# Patient Record
Sex: Male | Born: 2000 | Race: Black or African American | Hispanic: No | Marital: Single | Smoking: Current every day smoker
Health system: Southern US, Community
[De-identification: ages and names within clinical notes are randomized; demographics above are authoritative.]

## PROBLEM LIST (undated history)

## (undated) DIAGNOSIS — J45909 Unspecified asthma, uncomplicated: Secondary | ICD-10-CM

---
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
## 2018-05-25 ENCOUNTER — Encounter: Payer: Self-pay | Admitting: Emergency Medicine

## 2018-05-25 ENCOUNTER — Emergency Department
Admission: EM | Admit: 2018-05-25 | Discharge: 2018-05-25 | Disposition: A | Discharge status: Home / Self Care | Payer: Commercial Managed Care - PPO | Attending: Emergency Medicine | Admitting: Emergency Medicine

## 2018-05-25 DIAGNOSIS — J45909 Unspecified asthma, uncomplicated: Secondary | ICD-10-CM | POA: Diagnosis not present

## 2018-05-25 DIAGNOSIS — R062 Wheezing: Secondary | ICD-10-CM

## 2018-05-25 DIAGNOSIS — R0602 Shortness of breath: Secondary | ICD-10-CM

## 2018-05-25 DIAGNOSIS — J4521 Mild intermittent asthma with (acute) exacerbation: Secondary | ICD-10-CM | POA: Insufficient documentation

## 2018-05-25 DIAGNOSIS — R05 Cough: Secondary | ICD-10-CM

## 2018-05-25 DIAGNOSIS — R059 Cough, unspecified: Secondary | ICD-10-CM

## 2018-05-25 MED ORDER — IPRATROPIUM-ALBUTEROL 0.5-2.5 (3) MG/3ML IN SOLN
3.0000 mL | Freq: Once | RESPIRATORY_TRACT | Status: AC
  Administered 2018-05-25: 3 mL via RESPIRATORY_TRACT
  Filled 2018-05-25: qty 3

## 2018-05-25 MED ORDER — PREDNISONE 10 MG (21) PO TBPK: ORAL_TABLET | ORAL | 0 refills | Status: DC

## 2018-05-25 MED ORDER — ALBUTEROL SULFATE HFA 108 (90 BASE) MCG/ACT IN AERS: 2.0000 | INHALATION_SPRAY | RESPIRATORY_TRACT | 0 refills | Status: DC | PRN

## 2018-05-25 NOTE — Discharge Instructions (Addendum)
You have been diagnosed with an asthma exacerbation.  We gave you nebulizer treatment in the ER.  I have provided you with prescriptions for prednisone for 6 days and an albuterol inhaler.  If symptoms persist or worsen, please return to the ER.

## 2018-05-25 NOTE — ED Notes (Signed)
No peripheral IV placed this visit.    Discharge instructions reviewed with patient. Questions fielded by this RN. Patient verbalizes understanding of instructions. Patient discharged home in stable condition per provider. No acute distress noted at time of discharge.    

## 2018-05-25 NOTE — ED Triage Notes (Addendum)
Pt to ED with c/o of Asthma attack. Pt currently out of Asthma Rx. Pt NAD at this time and Sat 95% in triage.

## 2018-05-25 NOTE — ED Notes (Signed)
Here for asthma exacerbation. Inhaler at home ran out.

## 2018-05-25 NOTE — ED Provider Notes (Signed)
[facility] Emergency Department Provider Note ____________________________________________  Time seen: 82  I have reviewed the triage vital signs and the nursing notes.  HISTORY  Chief Complaint  Asthma   HPI Timothy Schaefer is a 18 y.o. male presents to the ER today with complaint of cough, wheezing and shortness of breath.  He reports this started yesterday.  He denies runny nose, nasal congestion, ear pain, sore throat.  He denies fever, chills or body aches.  He has not taken anything over-the-counter for his symptoms.  He has a history of asthma but has been out of his Pro-air.  He does vape at times and does smoke marijuana.  He has not had sick contacts that he is aware of.  He is up-to-date on his flu vaccine.  Past Medical History:  Diagnosis Date  . Asthma     There are no active problems to display for this patient.   History reviewed. No pertinent surgical history.  Prior to Admission medications   Medication Sig Start Date End Date Taking? Authorizing Provider  albuterol (PROVENTIL HFA;VENTOLIN HFA) 108 (90 Base) MCG/ACT inhaler Inhale 2 puffs into the lungs every 4 (four) hours as needed for wheezing or shortness of breath. 05/25/18   Lorre Munroe, NP  predniSONE (STERAPRED UNI-PAK 21 TAB) 10 MG (21) TBPK tablet As directed 05/25/18   Lorre Munroe, NP    Allergies Dairy aid [lactase]  History reviewed. No pertinent family history.  Social History Social History   Tobacco Use  . Smoking status: Never Smoker  . Smokeless tobacco: Never Used  Substance Use Topics  . Alcohol use: No  . Drug use: No    Review of Systems  Constitutional: Negative for fever, chills or body aches. ENT: Negative for runny nose, nasal congestion, ear pain or sore throat. Cardiovascular: Negative for chest pain. Respiratory: Positive for cough, wheezing and shortness of breath.   Gastrointestinal: Negative for abdominal pain, vomiting and  diarrhea. ____________________________________________  PHYSICAL EXAM:  VITAL SIGNS: ED Triage Vitals [05/25/18 1838]  Enc Vitals Group     BP 132/84     Pulse Rate (!) 114     Resp (!) 22     Temp 99.9 F (37.7 C)     Temp src      SpO2 96 %     Weight 140 lb (63.5 kg)     Height 5\' 6"  (1.676 m)     Head Circumference      Peak Flow      Pain Score 0     Pain Loc      Pain Edu?      Excl. in GC?     Constitutional: Alert and oriented. Well appearing and in no distress. Head: Normocephalic. Eyes: Conjunctivae are normal. PERRL. Normal extraocular movements Ears: Canals clear. TMs intact bilaterally. Nose: No congestion/rhinorrhea/epistaxis. Mouth/Throat: Mucous membranes are moist.  No posterior pharynx exudate or erythema noted Hematological/Lymphatic/Immunological: No cervical lymphadenopathy. Cardiovascular: Normal rate, regular rhythm.  Respiratory: Normal respiratory effort with bilateral expiratory wheezing noted. Neurologic:  Normal speech and language. No gross focal neurologic deficits are appreciated. ____________________________________________  INITIAL IMPRESSION / ASSESSMENT AND PLAN / ED COURSE  Cough, Wheezing, SOB, Asthma Exacerbation:  Duoneb given in ER Lungs much clearer, still with some mild intermittent expiratory wheezing RX for Pred Taper x 6 days RX for Proair inhaler ____________________________________________  FINAL CLINICAL IMPRESSION(S) / ED DIAGNOSES  Final diagnoses:  Cough  Wheezing  Shortness of breath  Mild intermittent asthma with exacerbation   [name], NP    Lorre MunroeBaity, Anastasya Jewell W, NP 05/25/18 Denyse [id number]    [place], [place], MD 05/26/18 [phone]

## 2018-07-30 IMAGING — CT CT CERVICAL SPINE W/O CM
3 of 7 series · 11 of 33 positions shown, 13 images · non-contrast
Comparison: None.

CLINICAL DATA: Fall, head and neck injury, pain

EXAM:
CT HEAD WITHOUT CONTRAST
CT CERVICAL SPINE WITHOUT CONTRAST
TECHNIQUE: Multidetector CT imaging of the head and cervical spine was
performed following the standard protocol without intravenous
contrast. Multiplanar CT image reconstructions of the cervical spine
were also generated.

[Series 10: sagittal bone · sagittal · 0.21mm/px · 5 of 61 slices shown]
[im 11/61  bone]
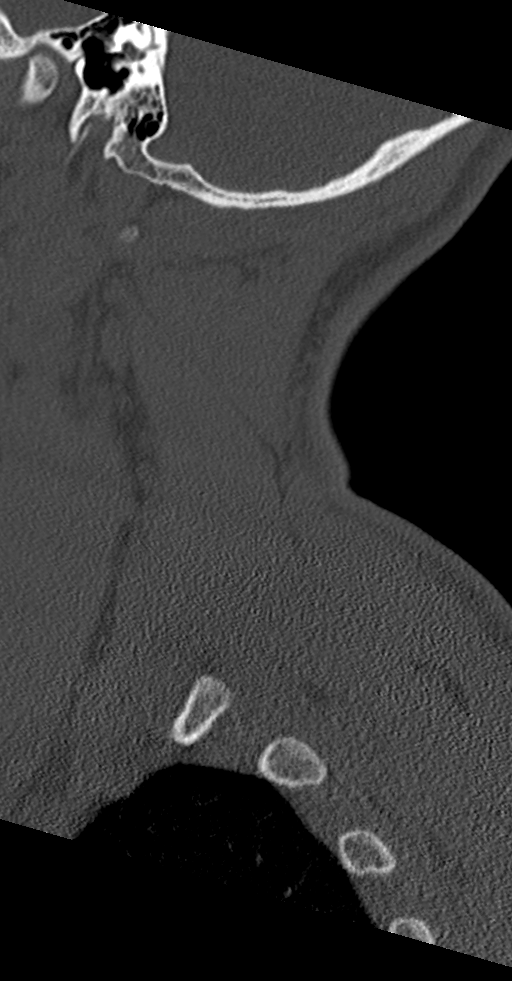
[im 21/61  bone]
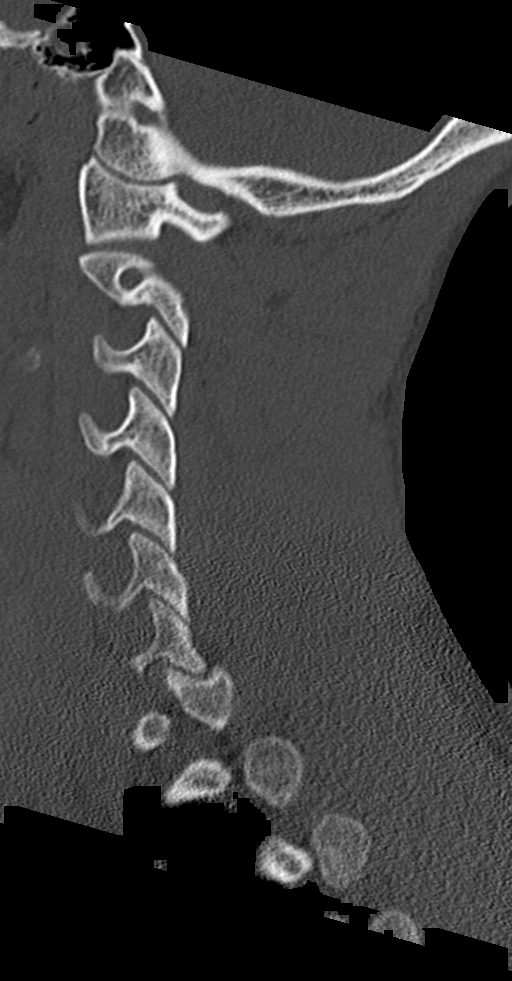
[im 31/61  bone]
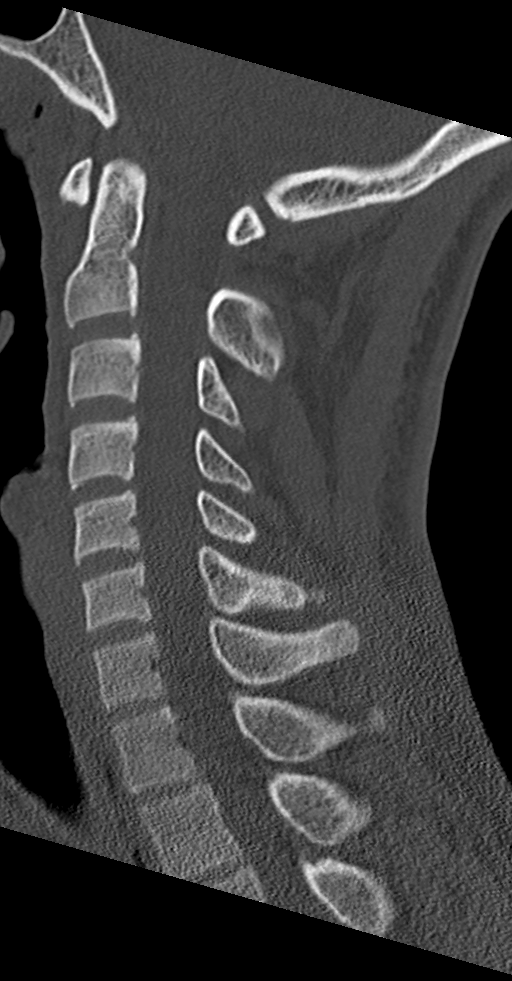
[im 41/61  bone]
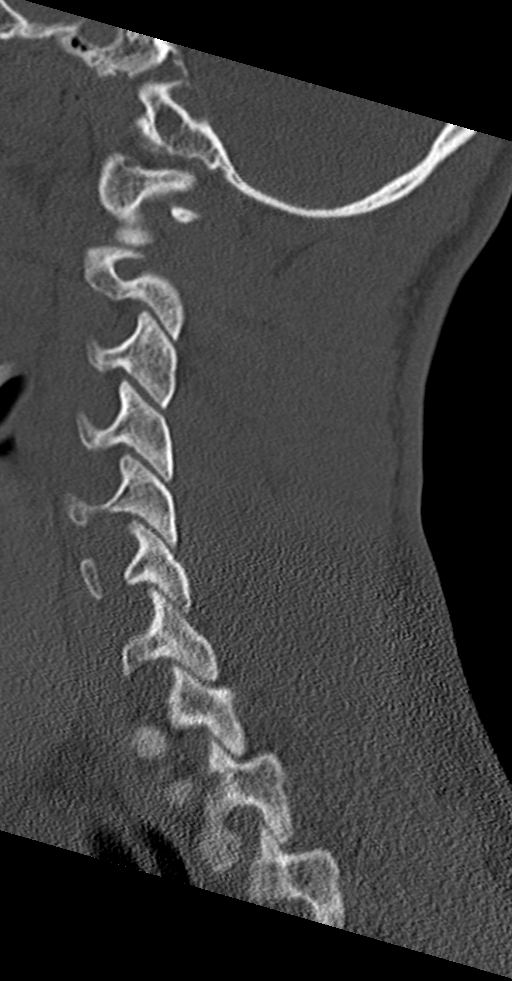
[im 51/61  bone]
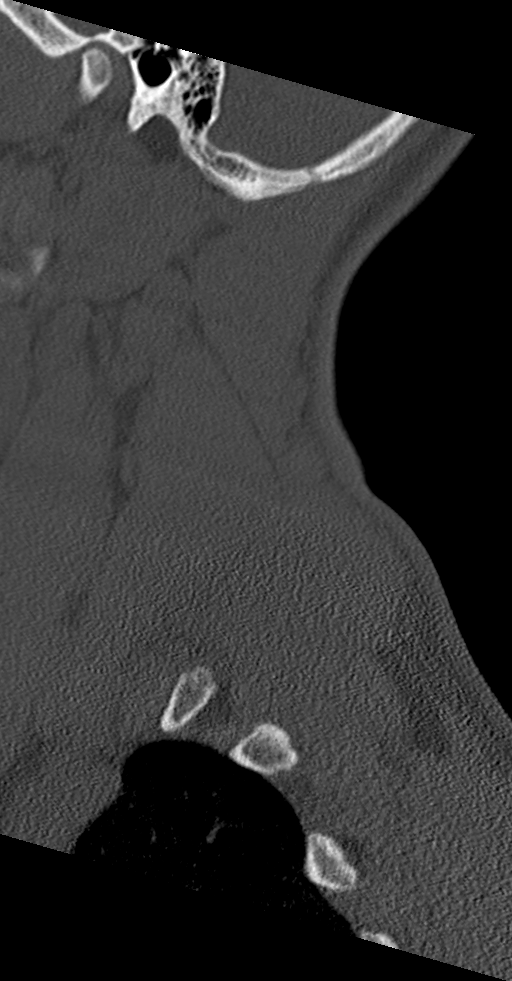

[Series 11: coronal bone · coronal · 0.23mm/px · 1 of 54 slices shown]
[im 27/54  bone]
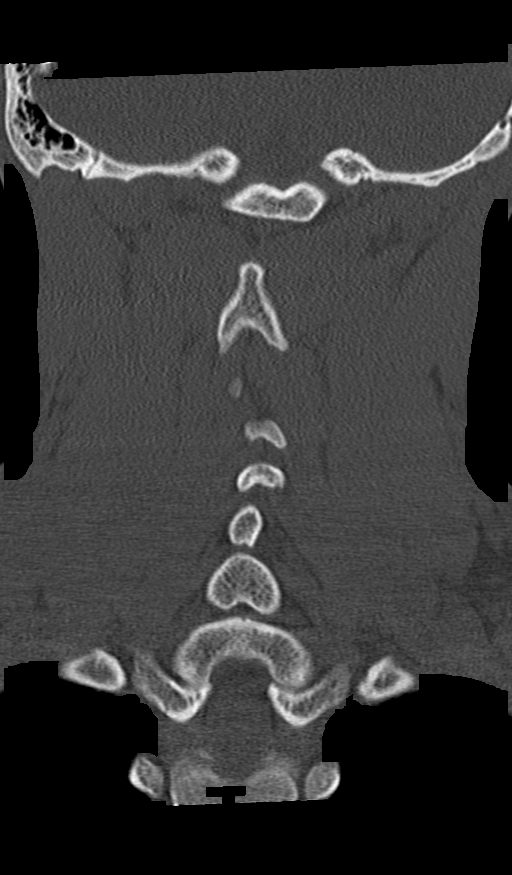

[Series 12: orthogonal bone · axial · 0.21mm/px · z∈[+604,+736]mm · 5 of 105 slices shown, 7 images]
[im 18/105  soft-tissue]
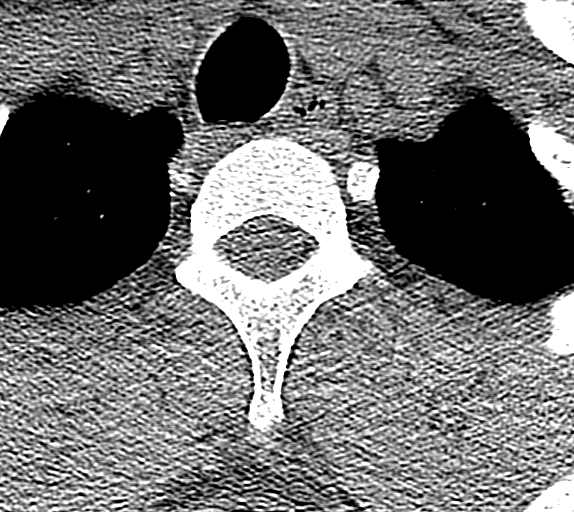
[im 18/105  bone]
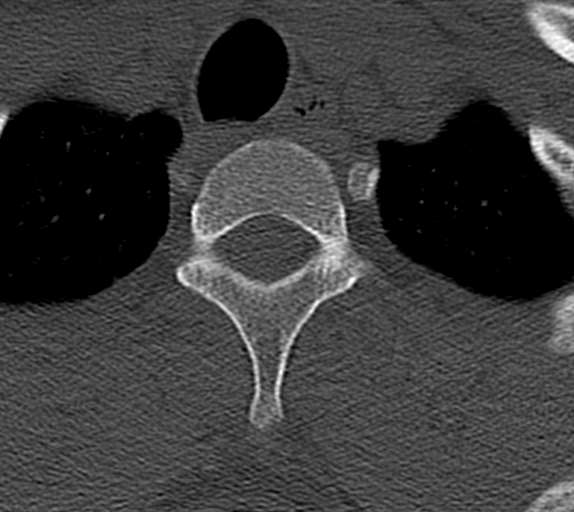
[im 35/105  bone]
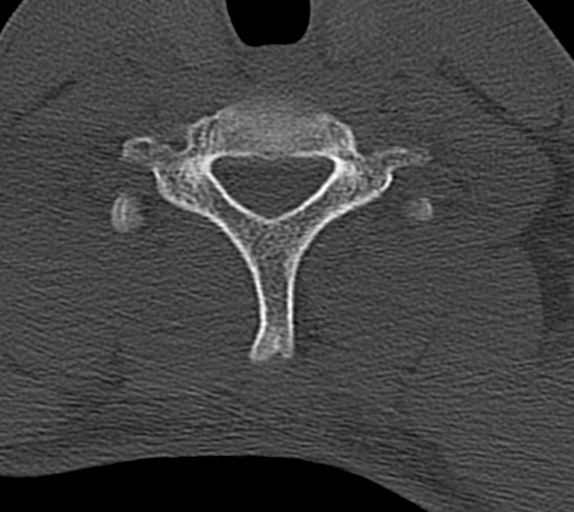
[im 53/105  bone]
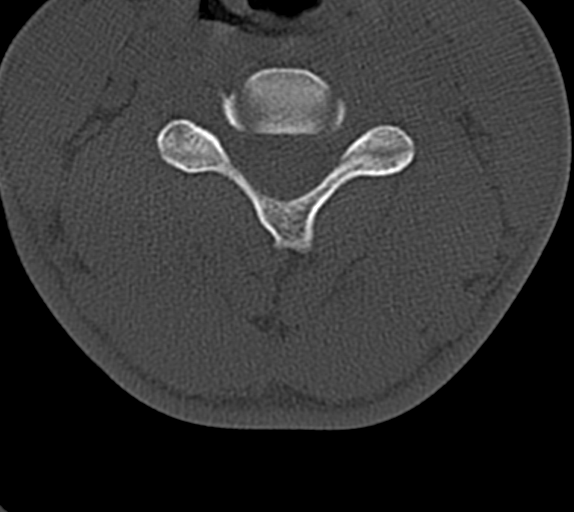
[im 70/105  bone]
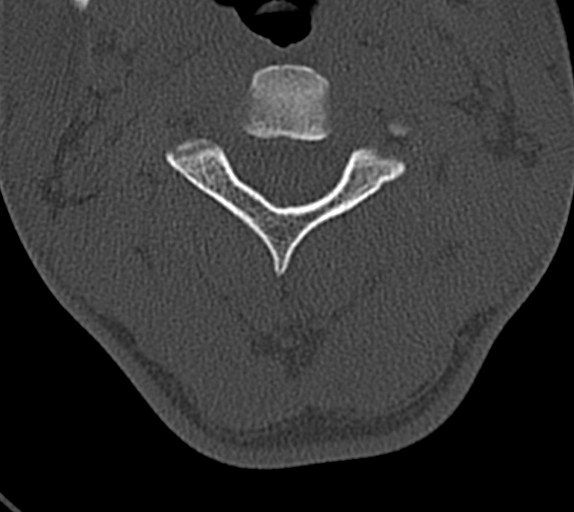
[im 87/105  soft-tissue]
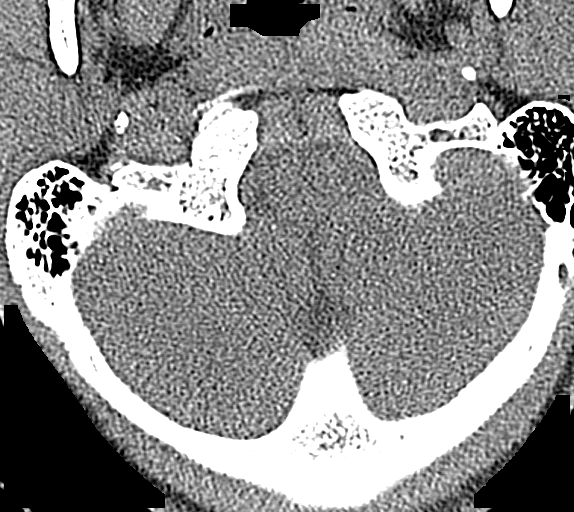
[im 87/105  bone]
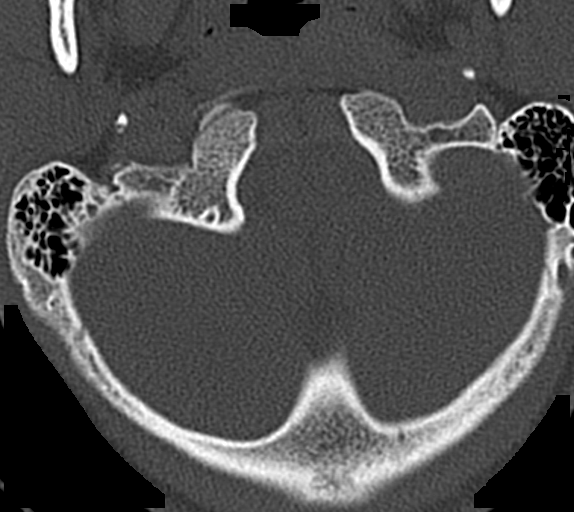

[11 of 33 positions shown; findings below may reference images not displayed]

FINDINGS: CT HEAD FINDINGS

Brain: No evidence of acute infarction, hemorrhage, hydrocephalus,
extra-axial collection or mass lesion/mass effect.

Vascular: No hyperdense vessel or unexpected calcification.

Skull: Normal. Negative for fracture or focal lesion.

Sinuses/Orbits: No acute finding.

Other: None.

CT CERVICAL SPINE FINDINGS

Alignment: Normal.

Skull base and vertebrae: No acute fracture. No primary bone lesion
or focal pathologic process.

Soft tissues and spinal canal: No prevertebral fluid or swelling. No
visible canal hematoma.

Disc levels: Preserved vertebral body heights and disc spaces. No
significant degenerative process or spondylosis.

Upper chest: Negative.

Other: None.
IMPRESSION: Normal head CT without contrast.

No acute intracranial abnormality

Normal cervical spine CT. No acute cervical spine fracture or
malalignment by CT.

## 2018-08-08 ENCOUNTER — Encounter (HOSPITAL_COMMUNITY): Payer: Self-pay | Admitting: Emergency Medicine

## 2018-08-08 ENCOUNTER — Other Ambulatory Visit: Payer: Self-pay

## 2018-08-08 ENCOUNTER — Emergency Department (HOSPITAL_COMMUNITY)
Admission: EM | Admit: 2018-08-08 | Discharge: 2018-08-08 | Disposition: A | Discharge status: Home / Self Care | Payer: Commercial Managed Care - PPO | Attending: Emergency Medicine | Admitting: Emergency Medicine

## 2018-08-08 DIAGNOSIS — J45909 Unspecified asthma, uncomplicated: Secondary | ICD-10-CM | POA: Diagnosis not present

## 2018-08-08 DIAGNOSIS — K611 Rectal abscess: Secondary | ICD-10-CM | POA: Insufficient documentation

## 2018-08-08 DIAGNOSIS — K6289 Other specified diseases of anus and rectum: Secondary | ICD-10-CM | POA: Diagnosis present

## 2018-08-08 MED ORDER — POVIDONE-IODINE 10 % EX SOLN
CUTANEOUS | Status: DC | PRN
  Administered 2018-08-08: 1 via TOPICAL

## 2018-08-08 MED ORDER — IBUPROFEN 600 MG PO TABS: 600.0000 mg | ORAL_TABLET | Freq: Four times a day (QID) | ORAL | 0 refills | Status: DC | PRN

## 2018-08-08 MED ORDER — AMOXICILLIN-POT CLAVULANATE 875-125 MG PO TABS: 1.0000 | ORAL_TABLET | Freq: Two times a day (BID) | ORAL | 0 refills | Status: DC

## 2018-08-08 MED ORDER — AMOXICILLIN-POT CLAVULANATE 875-125 MG PO TABS
1.0000 | ORAL_TABLET | Freq: Once | ORAL | Status: AC
  Administered 2018-08-08: 13:00:00 1 via ORAL
  Filled 2018-08-08: qty 1

## 2018-08-08 MED ORDER — PENTAFLUOROPROP-TETRAFLUOROETH EX AERO
INHALATION_SPRAY | CUTANEOUS | Status: DC | PRN
  Administered 2018-08-08: 1 via TOPICAL

## 2018-08-08 NOTE — ED Provider Notes (Signed)
[facility] EMERGENCY DEPARTMENT Provider Note   CSN: [id number] Arrival date & time: 08/08/18  1052    History   Chief Complaint Chief Complaint  Patient presents with  . Cyst    HPI Timothy Schaefer is a 18 y.o. male with a history of asthma only, reports having a small "bump" on his buttock for the past week.  Three days ago he fell on his buttocks while playing sports and this site has become more swollen, painful and today started draining pus and blood.  He denies a history of similar symptoms.  He also denies fevers, chills, n/v or other symptoms.  He has had no treatment prior to arrival.      The history is provided by the patient.    Past Medical History:  Diagnosis Date  . Asthma     There are no active problems to display for this patient.   History reviewed. No pertinent surgical history.      Home Medications    Prior to Admission medications   Medication Sig Start Date End Date Taking? Authorizing Provider  albuterol (PROVENTIL HFA;VENTOLIN HFA) 108 (90 Base) MCG/ACT inhaler Inhale 2 puffs into the lungs every 4 (four) hours as needed for wheezing or shortness of breath. 05/25/18   Lorre Munroe, NP  amoxicillin-clavulanate (AUGMENTIN) 875-125 MG tablet Take 1 tablet by mouth every 12 (twelve) hours. 08/08/18   Arturo Freundlich, Raynelle Fanning, PA-C  ibuprofen (ADVIL,MOTRIN) 600 MG tablet Take 1 tablet (600 mg total) by mouth every 6 (six) hours as needed for moderate pain. 08/08/18   Burgess Amor, PA-C  predniSONE (STERAPRED UNI-PAK 21 TAB) 10 MG (21) TBPK tablet As directed 05/25/18   Lorre Munroe, NP    Family History No family history on file.  Social History Social History   Tobacco Use  . Smoking status: Never Smoker  . Smokeless tobacco: Never Used  Substance Use Topics  . Alcohol use: No  . Drug use: Yes    Types: Marijuana     Allergies   Dairy aid [lactase]   Review of Systems Review of Systems  Constitutional: Negative for chills and fever.   HENT: Negative.   Eyes: Negative.   Respiratory: Negative for chest tightness and shortness of breath.   Cardiovascular: Negative for chest pain.  Gastrointestinal: Positive for rectal pain. Negative for abdominal pain, constipation, diarrhea, nausea and vomiting.  Genitourinary: Negative.   Musculoskeletal: Negative for arthralgias, joint swelling and neck pain.  Skin: Negative.  Negative for rash.  Neurological: Negative for dizziness, weakness, light-headedness, numbness and headaches.  Psychiatric/Behavioral: Negative.      Physical Exam Updated Vital Signs BP 138/77 (BP Location: Left Arm)   Pulse 61   Temp 98.6 F (37 C) (Oral)   Resp 16   Ht 5\' 8"  (1.727 m)   Wt 64 kg   SpO2 100%   BMI 21.44 kg/m   Physical Exam Constitutional:      General: He is not in acute distress.    Appearance: He is well-developed.  HENT:     Head: Normocephalic.  Neck:     Musculoskeletal: Neck supple.  Cardiovascular:     Rate and Rhythm: Normal rate.  Pulmonary:     Effort: Pulmonary effort is normal.     Breath sounds: No wheezing.  Musculoskeletal: Normal range of motion.  Skin:    General: Skin is warm.     Findings: Abscess present.     Comments: Small draining  abscess right  perirectal space, approx 1 cm fluctuant area surrounded by a small indurated rim.  No rectal involvement.  No red streaking. Serosanguinous drainage with gentle pressure.      ED Treatments / Results  Labs (all labs ordered are listed, but only abnormal results are displayed) Labs Reviewed - No data to display  EKG None  Radiology No results found.  Procedures Procedures (including critical care time)  INCISION AND DRAINAGE Performed by: Burgess AmorJulie Mikka Kissner Consent: Verbal consent obtained. Risks and benefits: risks, benefits and alternatives were discussed Type: abscess  Body area: perirectal  Anesthesia: local infiltration  Incision was made with a scalpel.  Local anesthetic: topical  freeze spray  Anesthetic total: n/a  Complexity: complex Blunt dissection to break up loculations  Drainage: mostly serosanguinous drainage,  Small amount of thick sebum like dc and purulence  Drainage amount: small  Packing material: none Patient tolerance: Patient tolerated the procedure well with no immediate complications.     Medications Ordered in ED Medications  pentafluoroprop-tetrafluoroeth (GEBAUERS) aerosol (has no administration in time range)  povidone-iodine (BETADINE) 10 % external solution (has no administration in time range)  amoxicillin-clavulanate (AUGMENTIN) 875-125 MG per tablet 1 tablet (has no administration in time range)     Initial Impression / Assessment and Plan / ED Course  I have reviewed the triage vital signs and the nursing notes.  Pertinent labs & imaging results that were available during my care of the patient were reviewed by me and considered in my medical decision making (see chart for details).        Discussed home care including warm soaks.  Pt started on augmentin.  Return precautions discussed.  Referral to Dr. Lovell SheehanJenkins for definitive care if sx persist or become recurrent.  Final Clinical Impressions(s) / ED Diagnoses   Final diagnoses:  Perirectal abscess    ED Discharge Orders         Ordered    amoxicillin-clavulanate (AUGMENTIN) 875-125 MG tablet  Every 12 hours     08/08/18 1241    ibuprofen (ADVIL,MOTRIN) 600 MG tablet  Every 6 hours PRN     08/08/18 1241           IdolRaynelle Fanning, Iria Jamerson, PA-C 08/08/18 1304    Samuel JesterMcManus, Kathleen, DO 08/08/18 1522

## 2018-08-08 NOTE — Discharge Instructions (Addendum)
Complete the entire course of the antibiotics.  I recommend a warm water soak (epsom salt preferred) 2-3 times daily along with gentle massage of the site to facilitate continued drainage as this area heals.  If you do not have time for a full soak,  a warm shower with the water focused on this site can also be effective.  Take the entire course of the antibiotics prescribed. Take your next dose of antibiotics tonight as you received a dose while here.

## 2018-08-08 NOTE — ED Triage Notes (Signed)
Had cyst on tail bone per pt   Has ruptured  Here for eval

## 2018-09-18 ENCOUNTER — Other Ambulatory Visit: Payer: Self-pay

## 2018-09-18 ENCOUNTER — Emergency Department (HOSPITAL_COMMUNITY)
Admission: EM | Admit: 2018-09-18 | Discharge: 2018-09-18 | Disposition: A | Discharge status: Home / Self Care | Payer: Commercial Managed Care - PPO | Attending: Emergency Medicine | Admitting: Emergency Medicine

## 2018-09-18 ENCOUNTER — Encounter (HOSPITAL_COMMUNITY): Payer: Self-pay

## 2018-09-18 DIAGNOSIS — J45901 Unspecified asthma with (acute) exacerbation: Secondary | ICD-10-CM | POA: Insufficient documentation

## 2018-09-18 DIAGNOSIS — R0602 Shortness of breath: Secondary | ICD-10-CM | POA: Diagnosis present

## 2018-09-18 MED ORDER — ALBUTEROL SULFATE HFA 108 (90 BASE) MCG/ACT IN AERS
2.0000 | INHALATION_SPRAY | Freq: Once | RESPIRATORY_TRACT | Status: AC
  Administered 2018-09-18: 2 via RESPIRATORY_TRACT
  Filled 2018-09-18: qty 6.7

## 2018-09-18 MED ORDER — PREDNISONE 20 MG PO TABS: 40.0000 mg | ORAL_TABLET | Freq: Every day | ORAL | 0 refills | Status: AC

## 2018-09-18 NOTE — Discharge Instructions (Signed)
We believe that your symptoms are caused today by an exacerbation of your asthma.  Please take the prescribed medications and any medications that you have at home.  Follow up with your doctor as recommended.  If you develop any new or worsening symptoms, including but not limited to fever, persistent vomiting, worsening shortness of breath, or other symptoms that concern you, please return to the Emergency Department immediately. ° ° °Asthma °Asthma is a recurring condition in which the airways tighten and narrow. Asthma can make it difficult to breathe. It can cause coughing, wheezing, and shortness of breath. Asthma episodes, also called asthma attacks, range from minor to life-threatening. Asthma cannot be cured, but medicines and lifestyle changes can help control it. °CAUSES °Asthma is believed to be caused by inherited (genetic) and environmental factors, but its exact cause is unknown. Asthma may be triggered by allergens, lung infections, or irritants in the air. Asthma triggers are different for each person. Common triggers include:  °Animal dander. °Dust mites. °Cockroaches. °Pollen from trees or grass. °Mold. °Smoke. °Air pollutants such as dust, household cleaners, hair sprays, aerosol sprays, paint fumes, strong chemicals, or strong odors. °Cold air, weather changes, and winds (which increase molds and pollens in the air). °Strong emotional expressions such as crying or laughing hard. °Stress. °Certain medicines (such as aspirin) or types of drugs (such as beta-blockers). °Sulfites in foods and drinks. Foods and drinks that may contain sulfites include dried fruit, potato chips, and sparkling grape juice. °Infections or inflammatory conditions such as the flu, a cold, or an inflammation of the nasal membranes (rhinitis). °Gastroesophageal reflux disease (GERD). °Exercise or strenuous activity. °SYMPTOMS °Symptoms may occur immediately after asthma is triggered or many hours later. Symptoms  include: °Wheezing. °Excessive nighttime or early morning coughing. °Frequent or severe coughing with a common cold. °Chest tightness. °Shortness of breath. °DIAGNOSIS  °The diagnosis of asthma is made by a review of your medical history and a physical exam. Tests may also be performed. These may include: °Lung function studies. These tests show how much air you breathe in and out. °Allergy tests. °Imaging tests such as X-rays. °TREATMENT  °Asthma cannot be cured, but it can usually be controlled. Treatment involves identifying and avoiding your asthma triggers. It also involves medicines. There are 2 classes of medicine used for asthma treatment:  °Controller medicines. These prevent asthma symptoms from occurring. They are usually taken every day. °Reliever or rescue medicines. These quickly relieve asthma symptoms. They are used as needed and provide short-term relief. °Your health care provider will help you create an asthma action plan. An asthma action plan is a written plan for managing and treating your asthma attacks. It includes a list of your asthma triggers and how they may be avoided. It also includes information on when medicines should be taken and when their dosage should be changed. An action plan may also involve the use of a device called a peak flow meter. A peak flow meter measures how well the lungs are working. It helps you monitor your condition. °HOME CARE INSTRUCTIONS  °Take medicines only as directed by your health care provider. Speak with your health care provider if you have questions about how or when to take the medicines. °Use a peak flow meter as directed by your health care provider. Record and keep track of readings. °Understand and use the action plan to help minimize or stop an asthma attack without needing to seek medical care. °Control your home environment in the following   ways to help prevent asthma attacks: °Do not smoke. Avoid being exposed to secondhand smoke. °Change  your heating and air conditioning filter regularly. °Limit your use of fireplaces and wood stoves. °Get rid of pests (such as roaches and mice) and their droppings. °Throw away plants if you see mold on them. °Clean your floors and dust regularly. Use unscented cleaning products. °Try to have someone else vacuum for you regularly. Stay out of rooms while they are being vacuumed and for a short while afterward. If you vacuum, use a dust mask from a hardware store, a double-layered or microfilter vacuum cleaner bag, or a vacuum cleaner with a HEPA filter. °Replace carpet with wood, tile, or vinyl flooring. Carpet can trap dander and dust. °Use allergy-proof pillows, mattress covers, and box spring covers. °Wash bed sheets and blankets every week in hot water and dry them in a dryer. °Use blankets that are made of polyester or cotton. °Clean bathrooms and kitchens with bleach. If possible, have someone repaint the walls in these rooms with mold-resistant paint. Keep out of the rooms that are being cleaned and painted. °Wash hands frequently. °SEEK MEDICAL CARE IF:  °You have wheezing, shortness of breath, or a cough even if taking medicine to prevent attacks. °The colored mucus you cough up (sputum) is thicker than usual. °Your sputum changes from clear or white to yellow, green, gray, or bloody. °You have any problems that may be related to the medicines you are taking (such as a rash, itching, swelling, or trouble breathing). °You are using a reliever medicine more than 2-3 times per week. °Your peak flow is still at 50-79% of your personal best after following your action plan for 1 hour. °You have a fever. °SEEK IMMEDIATE MEDICAL CARE IF:  °You seem to be getting worse and are unresponsive to treatment during an asthma attack. °You are short of breath even at rest. °You get short of breath when doing very little physical activity. °You have difficulty eating, drinking, or talking due to asthma symptoms. °You  develop chest pain. °You develop a fast heartbeat. °You have a bluish color to your lips or fingernails. °You are light-headed, dizzy, or faint. °Your peak flow is less than 50% of your personal best. °MAKE SURE YOU:  °Understand these instructions. °Will watch your condition. °Will get help right away if you are not doing well or get worse. °Document Released: 04/16/2005 Document Revised: 08/31/2013 Document Reviewed: 11/13/2012 °[employer]® Patient Information ©2015 [employer]. This information is not intended to replace advice given to you by your health care provider. Make sure you discuss any questions you have with your health care provider. ° °How to Use a Nebulizer °If you have asthma or other breathing problems, you might need to breathe in (inhale) medicine. This can be done with a nebulizer. A nebulizer is a device that turns liquid medicine into a mist that you can inhale.  °There are different kinds of nebulizers. Most are small. With some, you breathe in through a mouthpiece. With others, a mask fits over your nose and mouth. Most nebulizers must be connected to a small air compressor. Air is forced through tubing from the compressor to the nebulizer. The forced air changes the liquid into a fine spray. °RISKS AND COMPLICATIONS °The nebulizer must work properly for it to help your breathing. If the nebulizer does not produce mist, or if foam comes out, this indicates that the nebulizer is not working properly. Sometimes a filter can get clogged, or   there might be a problem with the air compressor. Check the instruction booklet that came with your nebulizer. It should tell you how to fix problems or where to call for help. You should have at least one extra nebulizer at home. That way, you will always have one when you need it.  °HOW TO PREPARE BEFORE USING THE NEBULIZER °Take these steps before using the nebulizer: °Check your medicine. Make sure it has not expired and is not damaged in any way.    °Wash your hands with soap and water.   °Put all the parts of your nebulizer on a sturdy, flat surface. Make sure the tubing connects the compressor and the nebulizer. °Measure the liquid medicine according to your health care provider's instructions. Pour it into the nebulizer. °Attach the mouthpiece or mask.   °Test the nebulizer by turning it on to make sure a spray is coming out. Then, turn it off.   °HOW TO USE THE NEBULIZER °Sit down and focus on staying relaxed.   °If your nebulizer has a mask, put it over your nose and mouth. If you use a mouthpiece, put it in your mouth. Press your lips firmly around the mouthpiece. °Turn on the nebulizer.   °Breathe out.   °Some nebulizers have a finger valve. If yours does, cover up the air hole so the air gets to the nebulizer. °Once the medicine begins to mist out, take slow, deep breaths. If there is a finger valve, release it at the end of your breath. °Continue taking slow, deep breaths until the nebulizer is empty.   °Be sure to stop the machine at any point if you start coughing or if the medicine foams or bubbles. °HOW TO CLEAN THE NEBULIZER  °The nebulizer and all its parts must be kept very clean. Follow the manufacturer's instructions for cleaning. For most nebulizers, you should follow these guidelines: °Wash the nebulizer after each use. Use warm water and soap. Rinse it well. Shake the nebulizer to remove extra water. Put it on a clean towel until it is completely dry. To make sure it is dry, put the nebulizer back together. Turn on the compressor for a few minutes. This will blow air through the nebulizer.   °Do not wash the tubing or the finger valve.   °Store the nebulizer in a dust-free place.   °Inspect the filter every week. Replace it any time it looks dirty.   °Sometimes the nebulizer will need a more complete cleaning. The instruction booklet should say how often you need to do this. °SEEK MEDICAL CARE IF:  °You continue to have difficulty  breathing.   °You have trouble using the nebulizer.   °Document Released: 04/04/2009 Document Revised: 08/31/2013 Document Reviewed: 10/06/2012 °[employer]® Patient Information ©2015 [employer]. This information is not intended to replace advice given to you by your health care provider. Make sure you discuss any questions you have with your health care provider. ° °How to Use an Inhaler °Proper inhaler technique is very important. Good technique ensures that the medicine reaches the lungs. Poor technique results in depositing the medicine on the tongue and back of the throat rather than in the airways. If you do not use the inhaler with good technique, the medicine will not help you. °STEPS TO FOLLOW IF USING AN INHALER WITHOUT AN EXTENSION TUBE °Remove the cap from the inhaler. °If you are using the inhaler for the first time, you will need to prime it. Shake the inhaler for   5 seconds and release four puffs into the air, away from your face. Ask your health care provider or pharmacist if you have questions about priming your inhaler. °Shake the inhaler for 5 seconds before each breath in (inhalation). °Position the inhaler so that the top of the canister faces up. °Put your index finger on the top of the medicine canister. Your thumb supports the bottom of the inhaler. °Open your mouth. °Either place the inhaler between your teeth and place your lips tightly around the mouthpiece, or hold the inhaler 1-2 inches away from your open mouth. If you are unsure of which technique to use, ask your health care provider. °Breathe out (exhale) normally and as completely as possible. °Press the canister down with your index finger to release the medicine. °At the same time as the canister is pressed, inhale deeply and slowly until your lungs are completely filled. This should take 4-6 seconds. Keep your tongue down. °Hold the medicine in your lungs for 5-10 seconds (10 seconds is best). This helps the medicine get into the  small airways of your lungs. °Breathe out slowly, through pursed lips. Whistling is an example of pursed lips. °Wait at least 15-30 seconds between puffs. Continue with the above steps until you have taken the number of puffs your health care provider has ordered. Do not use the inhaler more than your health care provider tells you. °Replace the cap on the inhaler. °Follow the directions from your health care provider or the inhaler insert for cleaning the inhaler. °STEPS TO FOLLOW IF USING AN INHALER WITH AN EXTENSION (SPACER) °Remove the cap from the inhaler. °If you are using the inhaler for the first time, you will need to prime it. Shake the inhaler for 5 seconds and release four puffs into the air, away from your face. Ask your health care provider or pharmacist if you have questions about priming your inhaler. °Shake the inhaler for 5 seconds before each breath in (inhalation). °Place the open end of the spacer onto the mouthpiece of the inhaler. °Position the inhaler so that the top of the canister faces up and the spacer mouthpiece faces you. °Put your index finger on the top of the medicine canister. Your thumb supports the bottom of the inhaler and the spacer. °Breathe out (exhale) normally and as completely as possible. °Immediately after exhaling, place the spacer between your teeth and into your mouth. Close your lips tightly around the spacer. °Press the canister down with your index finger to release the medicine. °At the same time as the canister is pressed, inhale deeply and slowly until your lungs are completely filled. This should take 4-6 seconds. Keep your tongue down and out of the way. °Hold the medicine in your lungs for 5-10 seconds (10 seconds is best). This helps the medicine get into the small airways of your lungs. Exhale. °Repeat inhaling deeply through the spacer mouthpiece. Again hold that breath for up to 10 seconds (10 seconds is best). Exhale slowly. If it is difficult to take  this second deep breath through the spacer, breathe normally several times through the spacer. Remove the spacer from your mouth. °Wait at least 15-30 seconds between puffs. Continue with the above steps until you have taken the number of puffs your health care provider has ordered. Do not use the inhaler more than your health care provider tells you. °Remove the spacer from the inhaler, and place the cap on the inhaler. °Follow the directions from your health care provider   or the inhaler insert for cleaning the inhaler and spacer. °If you are using different kinds of inhalers, use your quick relief medicine to open the airways 10-15 minutes before using a steroid if instructed to do so by your health care provider. If you are unsure which inhalers to use and the order of using them, ask your health care provider, nurse, or respiratory therapist. °If you are using a steroid inhaler, always rinse your mouth with water after your last puff, then gargle and spit out the water. Do not swallow the water. °AVOID: °Inhaling before or after starting the spray of medicine. It takes practice to coordinate your breathing with triggering the spray. °Inhaling through the nose (rather than the mouth) when triggering the spray. °HOW TO DETERMINE IF YOUR INHALER IS FULL OR NEARLY EMPTY °You cannot know when an inhaler is empty by shaking it. A few inhalers are now being made with dose counters. Ask your health care provider for a prescription that has a dose counter if you feel you need that extra help. If your inhaler does not have a counter, ask your health care provider to help you determine the date you need to refill your inhaler. Write the refill date on a calendar or your inhaler canister. Refill your inhaler 7-10 days before it runs out. Be sure to keep an adequate supply of medicine. This includes making sure it is not expired, and that you have a spare inhaler.  °SEEK MEDICAL CARE IF:  °Your symptoms are only partially  relieved with your inhaler. °You are having trouble using your inhaler. °You have some increase in phlegm. °SEEK IMMEDIATE MEDICAL CARE IF:  °You feel little or no relief with your inhalers. You are still wheezing and are feeling shortness of breath or tightness in your chest or both. °You have dizziness, headaches, or a fast heart rate. °You have chills, fever, or night sweats. °You have a noticeable increase in phlegm production, or there is blood in the phlegm. °MAKE SURE YOU:  °Understand these instructions. °Will watch your condition. °Will get help right away if you are not doing well or get worse. °Document Released: 04/13/2000 Document Revised: 02/04/2013 Document Reviewed: 11/13/2012 °[employer]® Patient Information ©2015 [employer]. This information is not intended to replace advice given to you by your health care provider. Make sure you discuss any questions you have with your health care provider. ° ° ° °

## 2018-09-18 NOTE — ED Triage Notes (Signed)
Pt ran out of his albuterol inhaler 2 weeks ago. Has been trying to go without it to join the [employer]. Pt has audible wheezes and rhonchi. Is SOB when moving.

## 2018-09-18 NOTE — ED Provider Notes (Signed)
Emergency Department Provider Note   I have reviewed the triage vital signs and the nursing notes.   HISTORY  Chief Complaint Asthma   HPI Timothy Schaefer is a 18 y.o. male with PMH of asthma presents to the ED with SOB symptoms starting this AM.  Patient states that he has history of asthma since childhood.  He has been trying to avoid taking his albuterol inhaler.  He tells me that he has to be off of the inhaler for 3 years prior to joining the [employer] and so often goes as long as possible.  This morning he noticed shortness of breath and wheezing symptoms.  He denies any chest pain, fevers, productive cough, chills, or body aches.  He tells me this feels like prior asthma exacerbations.  He does not have additional albuterol inhaler refills and so presents to the emergency department. No radiation of symptoms or modifying factors.   Past Medical History:  Diagnosis Date  . Asthma     There are no active problems to display for this patient.   History reviewed. No pertinent surgical history.  Allergies Dairy aid [lactase]  No family history on file.  Social History Social History   Tobacco Use  . Smoking status: Never Smoker  . Smokeless tobacco: Never Used  Substance Use Topics  . Alcohol use: No  . Drug use: Yes    Types: Marijuana    Review of Systems  Constitutional: No fever/chills Eyes: No visual changes. ENT: No sore throat. Cardiovascular: Denies chest pain. Respiratory: Positive shortness of breath and wheezing.  Gastrointestinal: No abdominal pain.  No nausea, no vomiting.  No diarrhea.  No constipation. Genitourinary: Negative for dysuria. Musculoskeletal: Negative for back pain. Skin: Negative for rash. Neurological: Negative for headaches, focal weakness or numbness.  10-point ROS otherwise negative.  ____________________________________________   PHYSICAL EXAM:  VITAL SIGNS: ED Triage Vitals  Enc Vitals Group     BP 09/18/18 0942  135/80     Pulse Rate 09/18/18 0942 (!) 53     Resp 09/18/18 0942 12     Temp 09/18/18 0942 98.2 F (36.8 C)     Temp Source 09/18/18 0942 Oral     SpO2 09/18/18 0942 100 %     Weight 09/18/18 0943 145 lb (65.8 kg)     Height 09/18/18 0943 5\' 8"  (1.727 m)   Constitutional: Alert and oriented. Well appearing and in no acute distress. Eyes: Conjunctivae are normal.  Head: Atraumatic. Nose: No congestion/rhinnorhea. Mouth/Throat: Mucous membranes are moist.  Neck: No stridor.  Cardiovascular: Normal rate, regular rhythm.  Respiratory: Normal respiratory effort.  No retractions. Lungs with end-expiratory wheezing bilaterally. Symmetrical exam.   Gastrointestinal: No distention.  Musculoskeletal: No lower extremity tenderness nor edema.  Neurologic:  Normal speech and language. Skin:  Skin is warm, dry and intact.  ____________________________________________  RADIOLOGY  None  ____________________________________________   PROCEDURES  Procedure(s) performed:   Procedures  None  ____________________________________________   INITIAL IMPRESSION / ASSESSMENT AND PLAN / ED COURSE  Pertinent labs & imaging results that were available during my care of the patient were reviewed by me and considered in my medical decision making (see chart for details).   Patient presents to the emergency department for evaluation of wheezing and shortness of breath which began this morning.  Patient has a symmetrical exam.  He is breathing easily and unlabored at rest.  Speaking in full sentences.  He has a symmetrical exam with end expiratory wheezing  noted.  No hypoxemia, tachycardia, hypotension, fever.  I do not feel he would benefit from x-ray of the chest at this time given his exam.  Plan for albuterol refill and steroid burst.  Discussed need for follow-up with the PCP and ED return precautions. Patient pleased at discharge.   ____________________________________________  FINAL CLINICAL  IMPRESSION(S) / ED DIAGNOSES  Final diagnoses:  Moderate asthma with exacerbation, unspecified whether persistent     MEDICATIONS GIVEN DURING THIS VISIT:  Medications  albuterol (VENTOLIN HFA) 108 (90 Base) MCG/ACT inhaler 2 puff (has no administration in time range)     NEW OUTPATIENT MEDICATIONS STARTED DURING THIS VISIT:  New Prescriptions   PREDNISONE (DELTASONE) 20 MG TABLET    Take 2 tablets (40 mg total) by mouth daily for 5 days.    Note:  This document was prepared using Dragon voice recognition software and may include unintentional dictation errors.  Alona Bene, MD Emergency Medicine    Long, Arlyss Repress, MD 09/18/18 1011

## 2018-12-31 ENCOUNTER — Other Ambulatory Visit: Payer: Self-pay | Admitting: Internal Medicine

## 2018-12-31 DIAGNOSIS — Z20822 Contact with and (suspected) exposure to covid-19: Secondary | ICD-10-CM

## 2019-01-01 LAB — NOVEL CORONAVIRUS, NAA: SARS-CoV-2, NAA: NOT DETECTED

## 2019-04-09 ENCOUNTER — Other Ambulatory Visit: Payer: Self-pay

## 2019-04-09 DIAGNOSIS — Z20822 Contact with and (suspected) exposure to covid-19: Secondary | ICD-10-CM

## 2019-04-10 LAB — NOVEL CORONAVIRUS, NAA: SARS-CoV-2, NAA: NOT DETECTED

## 2019-04-11 ENCOUNTER — Telehealth: Payer: Self-pay | Admitting: General Practice

## 2019-04-11 NOTE — Telephone Encounter (Signed)
Negative COVID results given. Patient results "NOT Detected." Caller expressed understanding. ° °

## 2019-06-12 ENCOUNTER — Encounter (HOSPITAL_COMMUNITY): Payer: Self-pay

## 2019-06-12 ENCOUNTER — Other Ambulatory Visit: Payer: Self-pay

## 2019-06-12 ENCOUNTER — Emergency Department (HOSPITAL_COMMUNITY)
Admission: EM | Admit: 2019-06-12 | Discharge: 2019-06-12 | Disposition: A | Discharge status: Home / Self Care | Payer: Commercial Managed Care - PPO | Attending: Emergency Medicine | Admitting: Emergency Medicine

## 2019-06-12 DIAGNOSIS — R062 Wheezing: Secondary | ICD-10-CM | POA: Diagnosis present

## 2019-06-12 DIAGNOSIS — J45901 Unspecified asthma with (acute) exacerbation: Secondary | ICD-10-CM

## 2019-06-12 MED ORDER — PREDNISONE 10 MG PO TABS: ORAL_TABLET | ORAL | 0 refills | Status: DC

## 2019-06-12 MED ORDER — PREDNISONE 10 MG PO TABS
60.0000 mg | ORAL_TABLET | Freq: Every day | ORAL | Status: DC
  Administered 2019-06-12: 60 mg via ORAL
  Filled 2019-06-12: qty 1

## 2019-06-12 MED ORDER — ALBUTEROL SULFATE HFA 108 (90 BASE) MCG/ACT IN AERS: 2.0000 | INHALATION_SPRAY | Freq: Four times a day (QID) | RESPIRATORY_TRACT | 1 refills | Status: DC | PRN

## 2019-06-12 MED ORDER — ALBUTEROL SULFATE HFA 108 (90 BASE) MCG/ACT IN AERS
8.0000 | INHALATION_SPRAY | Freq: Once | RESPIRATORY_TRACT | Status: AC
  Administered 2019-06-12: 21:00:00 8 via RESPIRATORY_TRACT
  Filled 2019-06-12: qty 6.7

## 2019-06-12 NOTE — ED Triage Notes (Signed)
Pt reports wheezing for the past two days, worse at night. Pt says he has a hx of asthma, but hasn't used an inhaler in two years, as he was trying to do without it (with hopes of joining [employer]). Pt reports he is no longer desires to join and needs an inhaler.

## 2019-06-12 NOTE — Discharge Instructions (Signed)
Return if any problems.

## 2019-06-12 NOTE — ED Provider Notes (Signed)
[facility] EMERGENCY DEPARTMENT Provider Note   CSN: [id number] Arrival date & time: 06/12/19  1941     History Chief Complaint  Patient presents with  . Wheezing    Timothy Schaefer is a 19 y.o. male.  The history is provided by the patient. No language interpreter was used.  Wheezing Severity:  Moderate Severity compared to prior episodes:  Similar Onset quality:  Gradual Duration:  1 day Timing:  Constant Progression:  Worsening Chronicity:  New Relieved by:  Nothing Worsened by:  Nothing Ineffective treatments:  None tried Associated symptoms: shortness of breath   Pt complains of asthma      Past Medical History:  Diagnosis Date  . Asthma     There are no problems to display for this patient.   History reviewed. No pertinent surgical history.     No family history on file.  Social History   Tobacco Use  . Smoking status: Never Smoker  . Smokeless tobacco: Never Used  Substance Use Topics  . Alcohol use: No  . Drug use: Yes    Types: Marijuana    Home Medications Prior to Admission medications   Medication Sig Start Date End Date Taking? Authorizing Provider  albuterol (PROVENTIL HFA;VENTOLIN HFA) 108 (90 Base) MCG/ACT inhaler Inhale 2 puffs into the lungs every 4 (four) hours as needed for wheezing or shortness of breath. 05/25/18   Lorre Munroe, NP  amoxicillin-clavulanate (AUGMENTIN) 875-125 MG tablet Take 1 tablet by mouth every 12 (twelve) hours. 08/08/18   Idol, Raynelle Fanning, PA-C  ibuprofen (ADVIL,MOTRIN) 600 MG tablet Take 1 tablet (600 mg total) by mouth every 6 (six) hours as needed for moderate pain. 08/08/18   Burgess Amor, PA-C    Allergies    Dairy aid [lactase]  Review of Systems   Review of Systems  Respiratory: Positive for shortness of breath and wheezing.   All other systems reviewed and are negative.   Physical Exam Updated Vital Signs BP 127/79 (BP Location: Right Arm)   Pulse 80   Temp 98.6 F (37 C) (Oral)   Resp 18    Ht 5\' 8"  (1.727 m)   Wt 65.8 kg   SpO2 100%   BMI 22.05 kg/m   Physical Exam Vitals and nursing note reviewed.  Constitutional:      Appearance: He is well-developed.  HENT:     Head: Normocephalic and atraumatic.  Eyes:     Conjunctiva/sclera: Conjunctivae normal.  Cardiovascular:     Rate and Rhythm: Normal rate and regular rhythm.     Heart sounds: No murmur.  Pulmonary:     Effort: No respiratory distress.     Breath sounds: Wheezing present.  Abdominal:     Palpations: Abdomen is soft.     Tenderness: There is no abdominal tenderness.  Musculoskeletal:        General: Normal range of motion.     Cervical back: Neck supple.  Skin:    General: Skin is warm and dry.  Neurological:     General: No focal deficit present.     Mental Status: He is alert.  Psychiatric:        Mood and Affect: Mood normal.     ED Results / Procedures / Treatments   Labs (all labs ordered are listed, but only abnormal results are displayed) Labs Reviewed - No data to display  EKG None  Radiology No results found.  Procedures Procedures (including critical care time)  Medications Ordered in ED  Medications  albuterol (VENTOLIN HFA) 108 (90 Base) MCG/ACT inhaler 8 puff (has no administration in time range)  predniSONE (DELTASONE) tablet 60 mg (has no administration in time range)    ED Course  I have reviewed the triage vital signs and the nursing notes.  Pertinent labs & imaging results that were available during my care of the patient were reviewed by me and considered in my medical decision making (see chart for details).    MDM Rules/Calculators/A&P                      MDM: Pt given prednisone and albuterol inhaler Final Clinical Impression(s) / ED Diagnoses Final diagnoses:  Moderate asthma with exacerbation, unspecified whether persistent    Rx / DC Orders ED Discharge Orders         Ordered    predniSONE (DELTASONE) 10 MG tablet     06/12/19 2042     albuterol (VENTOLIN HFA) 108 (90 Base) MCG/ACT inhaler  Every 6 hours PRN     06/12/19 2042           Sidney Ace 06/12/19 2042    Milton Ferguson, MD 06/14/19 [phone]

## 2020-04-06 ENCOUNTER — Emergency Department (HOSPITAL_COMMUNITY)
Admission: EM | Admit: 2020-04-06 | Discharge: 2020-04-07 | Disposition: A | Discharge status: Home / Self Care | Payer: Commercial Managed Care - PPO | Attending: Emergency Medicine | Admitting: Emergency Medicine

## 2020-04-06 ENCOUNTER — Other Ambulatory Visit: Payer: Self-pay

## 2020-04-06 ENCOUNTER — Encounter (HOSPITAL_COMMUNITY): Payer: Self-pay | Admitting: Emergency Medicine

## 2020-04-06 DIAGNOSIS — F17210 Nicotine dependence, cigarettes, uncomplicated: Secondary | ICD-10-CM | POA: Diagnosis not present

## 2020-04-06 DIAGNOSIS — T781XXA Other adverse food reactions, not elsewhere classified, initial encounter: Secondary | ICD-10-CM | POA: Diagnosis not present

## 2020-04-06 DIAGNOSIS — J45909 Unspecified asthma, uncomplicated: Secondary | ICD-10-CM | POA: Insufficient documentation

## 2020-04-06 DIAGNOSIS — L509 Urticaria, unspecified: Secondary | ICD-10-CM | POA: Diagnosis not present

## 2020-04-06 MED ORDER — DIPHENHYDRAMINE HCL 50 MG/ML IJ SOLN
25.0000 mg | Freq: Once | INTRAMUSCULAR | Status: AC
  Administered 2020-04-06: 25 mg via INTRAVENOUS
  Filled 2020-04-06: qty 1

## 2020-04-06 MED ORDER — ALBUTEROL SULFATE HFA 108 (90 BASE) MCG/ACT IN AERS
2.0000 | INHALATION_SPRAY | Freq: Once | RESPIRATORY_TRACT | Status: AC
  Administered 2020-04-06: 2 via RESPIRATORY_TRACT
  Filled 2020-04-06: qty 6.7

## 2020-04-06 MED ORDER — PREDNISONE 50 MG PO TABS: 50.0000 mg | ORAL_TABLET | Freq: Every day | ORAL | 0 refills | Status: DC

## 2020-04-06 MED ORDER — FAMOTIDINE IN NACL 20-0.9 MG/50ML-% IV SOLN
20.0000 mg | Freq: Once | INTRAVENOUS | Status: AC
  Administered 2020-04-06: 20 mg via INTRAVENOUS
  Filled 2020-04-06: qty 50

## 2020-04-06 MED ORDER — METHYLPREDNISOLONE SODIUM SUCC 125 MG IJ SOLR
125.0000 mg | Freq: Once | INTRAMUSCULAR | Status: AC
  Administered 2020-04-06: 125 mg via INTRAVENOUS
  Filled 2020-04-06: qty 2

## 2020-04-06 NOTE — ED Provider Notes (Signed)
[facility] EMERGENCY DEPARTMENT Provider Note   CSN: [id number] Arrival date & time: 04/06/20  2200   History Chief Complaint  Patient presents with  . Allergic Reaction    Timothy Schaefer is a 19 y.o. male.  The history is provided by the patient.  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  He took some diphenhydramine without relief.  Past Medical History:  Diagnosis Date  . Asthma     There are no problems to display for this patient.   History reviewed. No pertinent surgical history.     History reviewed. No pertinent family history.  Social History   Tobacco Use  . Smoking status: Current Every Day Smoker    Types: Cigarettes  . Smokeless tobacco: Never Used  Vaping Use  . Vaping Use: Former  Substance Use Topics  . Alcohol use: No  . Drug use: Yes    Frequency: 5.0 times per week    Types: Marijuana    Home Medications Prior to Admission medications   Medication Sig Start Date End Date Taking? Authorizing Provider  albuterol (VENTOLIN HFA) 108 (90 Base) MCG/ACT inhaler Inhale 2 puffs into the lungs every 6 (six) hours as needed for wheezing or shortness of breath. 06/12/19   Elson Areas, PA-C  amoxicillin-clavulanate (AUGMENTIN) 875-125 MG tablet Take 1 tablet by mouth every 12 (twelve) hours. 08/08/18   Idol, Raynelle Fanning, PA-C  ibuprofen (ADVIL,MOTRIN) 600 MG tablet Take 1 tablet (600 mg total) by mouth every 6 (six) hours as needed for moderate pain. 08/08/18   Burgess Amor, PA-C  predniSONE (DELTASONE) 10 MG tablet [id number] taper 06/12/19   Elson Areas, PA-C    Allergies    Dairy aid [lactase]  Review of Systems   Review of Systems  All other systems reviewed and are negative.   Physical Exam Updated Vital Signs BP (!) 151/135 (BP Location: Right Arm)   Pulse (!) 120   Temp 98.3 F  (36.8 C) (Oral)   Resp 20   Ht 5\' 7"  (1.702 m)   Wt 65.8 kg   SpO2 96%   BMI 22.71 kg/m   Physical Exam Vitals and nursing note reviewed.   19 year old male, resting comfortably and in no acute distress. Vital signs are significant for elevated heart rate and blood pressure. Oxygen saturation is 96%, which is normal. Head is normocephalic and atraumatic. PERRLA, EOMI. Oropharynx is clear.  There is no swelling of the tongue or sublingual tissues.  There is no pooling of secretions.  Phonation is normal. Neck is nontender and supple without adenopathy or JVD. Back is nontender and there is no CVA tenderness. Lungs have some scattered, high-pitched wheezes.  There are no rales or rhonchi. Chest is nontender. Heart has regular rate and rhythm without murmur. Abdomen is soft, flat, nontender without masses or hepatosplenomegaly and peristalsis is normoactive. Extremities have no cyanosis or edema, full range of motion is present. Skin is warm and dry.  Faint urticarial rash is present. Neurologic: Mental status is normal, cranial nerves are intact, there are no motor or sensory deficits.  ED Results / Procedures / Treatments    Procedures Procedures  Medications Ordered in ED Medications  diphenhydrAMINE (BENADRYL) injection 25 mg (has no administration in time range)  famotidine (PEPCID) IVPB 20 mg premix (has no administration in  time range)  methylPREDNISolone sodium succinate (SOLU-MEDROL) 125 mg/2 mL injection 125 mg (has no administration in time range)  albuterol (VENTOLIN HFA) 108 (90 Base) MCG/ACT inhaler 2 puff (has no administration in time range)    ED Course  I have reviewed the triage vital signs and the nursing notes.  MDM Rules/Calculators/A&P Acute allergic reaction with urticaria and bronchospasm.  He will be given additional diphenhydramine as well as famotidine and methylprednisolone.  Is also given an albuterol inhaler.  Old records are reviewed, and he does  have ED visits for asthma, but none for allergies.  Case is signed out to Dr. Blinda Leatherwood.  Final Clinical Impression(s) / ED Diagnoses Final diagnoses:  Allergic reaction to food, initial encounter    Rx / DC Orders ED Discharge Orders    None       Dione Booze, MD 04/06/20 2336

## 2020-04-06 NOTE — Discharge Instructions (Addendum)
Take cetirizine (Zyrtec) or loratadine (Claritin) once a day for the next five days, then as needed.

## 2020-04-06 NOTE — ED Triage Notes (Signed)
Pt c/o allergic reaction to cheese. Pt last took benadryl at 2000 or 2100 w/o releif.  Pt states he is SOB and itchy all over.

## 2021-04-27 ENCOUNTER — Other Ambulatory Visit: Payer: Self-pay

## 2021-04-27 ENCOUNTER — Encounter (HOSPITAL_COMMUNITY): Payer: Self-pay | Admitting: Emergency Medicine

## 2021-04-27 ENCOUNTER — Emergency Department (HOSPITAL_COMMUNITY)
Admission: EM | Admit: 2021-04-27 | Discharge: 2021-04-27 | Disposition: A | Discharge status: Home / Self Care | Payer: Commercial Managed Care - PPO | Attending: Emergency Medicine | Admitting: Emergency Medicine

## 2021-04-27 DIAGNOSIS — F17210 Nicotine dependence, cigarettes, uncomplicated: Secondary | ICD-10-CM | POA: Insufficient documentation

## 2021-04-27 DIAGNOSIS — R062 Wheezing: Secondary | ICD-10-CM

## 2021-04-27 DIAGNOSIS — J45909 Unspecified asthma, uncomplicated: Secondary | ICD-10-CM | POA: Insufficient documentation

## 2021-04-27 MED ORDER — ALBUTEROL SULFATE HFA 108 (90 BASE) MCG/ACT IN AERS
2.0000 | INHALATION_SPRAY | RESPIRATORY_TRACT | Status: DC | PRN
  Administered 2021-04-27: 23:00:00 2 via RESPIRATORY_TRACT
  Filled 2021-04-27: qty 6.7

## 2021-04-27 MED ORDER — ALBUTEROL SULFATE HFA 108 (90 BASE) MCG/ACT IN AERS: 2.0000 | INHALATION_SPRAY | Freq: Four times a day (QID) | RESPIRATORY_TRACT | 1 refills | Status: AC | PRN

## 2021-04-27 NOTE — ED Notes (Signed)
Pt placed on cardiac monitor with BP to set cycle every 30 minutes. Continuous pulse oximeter applied.  

## 2021-04-27 NOTE — Discharge Instructions (Signed)
You were evaluated in the Emergency Department and after careful evaluation, we did not find any emergent condition requiring admission or further testing in the hospital.  Your exam/testing today was overall reassuring.  Symptoms seem to be due to a flare of your asthma.  Use the inhaler as needed at home and follow-up with your regular doctor.  Please return to the Emergency Department if you experience any worsening of your condition.  Thank you for allowing [place] to be a part of your care.

## 2021-04-27 NOTE — ED Triage Notes (Signed)
Pt states he has asthma and has noticed wheezing over the last couple of days. Pt states he has been out of his Albuterol inhaler for 6 months. Denies any sob.

## 2021-04-27 NOTE — ED Provider Notes (Signed)
[facility] Emergency Department Provider Note MRN:  [id number]  Arrival date & time: 04/27/21     Chief Complaint   Wheezing   History of Present Illness   Timothy Schaefer is a 20 y.o. year-old male with a history of asthma presenting to the ED with chief complaint of wheezing.  Intermittent wheezing for the past 2 or 3 days.  Associated with some mild shortness of breath.  Denies any recent fever or cough or cold-like symptoms.  No chest pain, no abdominal pain, no leg pain or swelling, no other complaints.  Ran out of his inhaler a few weeks ago.  Symptoms mild, intermittent, no exacerbating or alleviating factors.  Review of Systems  A complete 10 system review of systems was obtained and all systems are negative except as noted in the HPI and PMH.   Patient's Health History    Past Medical History:  Diagnosis Date   Asthma     History reviewed. No pertinent surgical history.  History reviewed. No pertinent family history.  Social History   Socioeconomic History   Marital status: Single    Spouse name: Not on file   Number of children: Not on file   Years of education: Not on file   Highest education level: Not on file  Occupational History   Not on file  Tobacco Use   Smoking status: Every Day    Types: Cigarettes   Smokeless tobacco: Never  Vaping Use   Vaping Use: Former  Substance and Sexual Activity   Alcohol use: No   Drug use: Yes    Frequency: 5.0 times per week    Types: Marijuana   Sexual activity: Not on file  Other Topics Concern   Not on file  Social History Narrative   Not on file   Social Determinants of Health   Financial Resource Strain: Not on file  Food Insecurity: Not on file  Transportation Needs: Not on file  Physical Activity: Not on file  Stress: Not on file  Social Connections: Not on file  Intimate Partner Violence: Not on file     Physical Exam   Vitals:   04/27/21 2258 04/27/21 2300  BP:  123/75   Pulse: 63 (!) 58  Resp: 13 10  Temp:    SpO2: 100% 98%    CONSTITUTIONAL: Well-appearing, NAD NEURO:  Alert and oriented x 3, no focal deficits EYES:  eyes equal and reactive ENT/NECK:  no LAD, no JVD CARDIO: Regular rate, well-perfused, normal S1 and S2 PULM:  CTAB no wheezing or rhonchi GI/GU:  normal bowel sounds, non-distended, non-tender MSK/SPINE:  No gross deformities, no edema SKIN:  no rash, atraumatic PSYCH:  Appropriate speech and behavior  *Additional and/or pertinent findings included in MDM below  Diagnostic and Interventional Summary    EKG Interpretation  Date/Time:    Ventricular Rate:    PR Interval:    QRS Duration:   QT Interval:    QTC Calculation:   R Axis:     Text Interpretation:         Labs Reviewed - No data to display  No orders to display    Medications  albuterol (VENTOLIN HFA) 108 (90 Base) MCG/ACT inhaler 2 puff (2 puffs Inhalation Given 04/27/21 2310)     Procedures  /  Critical Care Procedures  ED Course and Medical Decision Making  I have reviewed the triage vital signs, the nursing notes, and pertinent available records from the EMR.  Listed above are laboratory and imaging tests that I personally ordered, reviewed, and interpreted and then considered in my medical decision making (see below for details).  Patient feeling much better after inhaler usage here in the emergency department, sitting comfortably, no increased work of breathing, lungs clear, vital signs normal, otherwise no complaints.  Appropriate for discharge with med refill.       Elmer Sow. Pilar Plate, MD [facility] Emergency Medicine [facility] [email or web address]  Final Clinical Impressions(s) / ED Diagnoses     ICD-10-CM   1. Wheezing  R06.2       ED Discharge Orders          Ordered    albuterol (VENTOLIN HFA) 108 (90 Base) MCG/ACT inhaler  Every 6 hours PRN        04/27/21 2313             Discharge Instructions Discussed  with and Provided to Patient:    Discharge Instructions      You were evaluated in the Emergency Department and after careful evaluation, we did not find any emergent condition requiring admission or further testing in the hospital.  Your exam/testing today was overall reassuring.  Symptoms seem to be due to a flare of your asthma.  Use the inhaler as needed at home and follow-up with your regular doctor.  Please return to the Emergency Department if you experience any worsening of your condition.  Thank you for allowing [place] to be a part of your care.        Sabas Sous, MD 04/27/21 [phone]

## 2022-10-06 ENCOUNTER — Other Ambulatory Visit: Payer: Self-pay

## 2022-10-06 ENCOUNTER — Encounter (HOSPITAL_COMMUNITY): Payer: Self-pay | Admitting: *Deleted

## 2022-10-06 ENCOUNTER — Emergency Department (HOSPITAL_COMMUNITY)
Admission: EM | Admit: 2022-10-06 | Discharge: 2022-10-06 | Disposition: A | Discharge status: Home / Self Care | Payer: Commercial Managed Care - PPO | Attending: Emergency Medicine | Admitting: Emergency Medicine

## 2022-10-06 ENCOUNTER — Emergency Department (HOSPITAL_COMMUNITY): Payer: Commercial Managed Care - PPO

## 2022-10-06 DIAGNOSIS — M25562 Pain in left knee: Secondary | ICD-10-CM | POA: Insufficient documentation

## 2022-10-06 DIAGNOSIS — M7918 Myalgia, other site: Secondary | ICD-10-CM

## 2022-10-06 DIAGNOSIS — M25561 Pain in right knee: Secondary | ICD-10-CM | POA: Diagnosis not present

## 2022-10-06 DIAGNOSIS — Y92410 Unspecified street and highway as the place of occurrence of the external cause: Secondary | ICD-10-CM | POA: Diagnosis not present

## 2022-10-06 DIAGNOSIS — M25511 Pain in right shoulder: Secondary | ICD-10-CM | POA: Insufficient documentation

## 2022-10-06 DIAGNOSIS — R0781 Pleurodynia: Secondary | ICD-10-CM | POA: Insufficient documentation

## 2022-10-06 MED ORDER — IBUPROFEN 600 MG PO TABS: 600.0000 mg | ORAL_TABLET | Freq: Three times a day (TID) | ORAL | 0 refills | Status: DC

## 2022-10-06 NOTE — Discharge Instructions (Signed)
Your x-rays are negative for any acute obvious injuries such as fractures or dislocation.  I suspect your pain symptoms will gradually improve over the next week to 10 days.  I recommend using ice packs anywhere you are hurting as much is as comfortable for the next 48 hours, after this time you can also add heat therapy by using a heating pad for 20 minutes 2-3 times daily.  You may continue using your Tylenol, have also added ibuprofen prescription strength which will also give you increased pain relief.
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
## 2022-10-06 NOTE — ED Notes (Signed)
Patient transported to X-ray 

## 2024-01-10 ENCOUNTER — Encounter (HOSPITAL_COMMUNITY): Payer: Self-pay

## 2024-01-10 ENCOUNTER — Emergency Department (HOSPITAL_COMMUNITY)
Admission: EM | Admit: 2024-01-10 | Discharge: 2024-01-10 | Disposition: A | Discharge status: Home / Self Care | Source: Home / Self Care | Attending: Emergency Medicine | Admitting: Emergency Medicine

## 2024-01-10 ENCOUNTER — Emergency Department (HOSPITAL_COMMUNITY)

## 2024-01-10 DIAGNOSIS — M25531 Pain in right wrist: Secondary | ICD-10-CM | POA: Insufficient documentation

## 2024-01-10 DIAGNOSIS — J939 Pneumothorax, unspecified: Secondary | ICD-10-CM | POA: Diagnosis not present

## 2024-01-10 DIAGNOSIS — J9383 Other pneumothorax: Secondary | ICD-10-CM | POA: Insufficient documentation

## 2024-01-10 DIAGNOSIS — S270XXA Traumatic pneumothorax, initial encounter: Secondary | ICD-10-CM | POA: Diagnosis not present

## 2024-01-10 MED ORDER — HYDROCODONE-ACETAMINOPHEN 5-325 MG PO TABS: ORAL_TABLET | ORAL | 0 refills | Status: DC

## 2024-01-10 NOTE — ED Notes (Signed)
 Pt/family received d/c paperwork at this time. After going over the paperwork any questions, comments, or concerns were answered to the best of this nurse's knowledge. The pt/family verbally acknowledged the teachings/instructions.
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
## 2024-01-10 NOTE — Discharge Instructions (Signed)
 Return here tomorrow morning at 7 AM to get a repeat chest x-ray.  If you get worse in the melanite come back sooner
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
## 2024-01-11 ENCOUNTER — Inpatient Hospital Stay (HOSPITAL_COMMUNITY)
Admission: EM | Admit: 2024-01-11 | Discharge: 2024-01-14 | DRG: 200 | Disposition: A | Discharge status: Home / Self Care | Attending: Surgery | Admitting: Surgery

## 2024-01-11 ENCOUNTER — Other Ambulatory Visit: Payer: Self-pay

## 2024-01-11 ENCOUNTER — Emergency Department (HOSPITAL_COMMUNITY)

## 2024-01-11 DIAGNOSIS — Z79899 Other long term (current) drug therapy: Secondary | ICD-10-CM

## 2024-01-11 DIAGNOSIS — Z91011 Allergy to milk products: Secondary | ICD-10-CM

## 2024-01-11 DIAGNOSIS — Z7952 Long term (current) use of systemic steroids: Secondary | ICD-10-CM | POA: Diagnosis not present

## 2024-01-11 DIAGNOSIS — R0781 Pleurodynia: Secondary | ICD-10-CM | POA: Diagnosis not present

## 2024-01-11 DIAGNOSIS — W1839XA Other fall on same level, initial encounter: Secondary | ICD-10-CM | POA: Diagnosis present

## 2024-01-11 DIAGNOSIS — Z743 Need for continuous supervision: Secondary | ICD-10-CM | POA: Diagnosis not present

## 2024-01-11 DIAGNOSIS — S2231XA Fracture of one rib, right side, initial encounter for closed fracture: Secondary | ICD-10-CM | POA: Diagnosis present

## 2024-01-11 DIAGNOSIS — F17290 Nicotine dependence, other tobacco product, uncomplicated: Secondary | ICD-10-CM | POA: Diagnosis present

## 2024-01-11 DIAGNOSIS — S63501A Unspecified sprain of right wrist, initial encounter: Secondary | ICD-10-CM | POA: Diagnosis present

## 2024-01-11 DIAGNOSIS — S270XXA Traumatic pneumothorax, initial encounter: Principal | ICD-10-CM | POA: Diagnosis present

## 2024-01-11 DIAGNOSIS — J939 Pneumothorax, unspecified: Secondary | ICD-10-CM | POA: Diagnosis present

## 2024-01-11 LAB — HIV ANTIBODY (ROUTINE TESTING W REFLEX): HIV Screen 4th Generation wRfx: NONREACTIVE

## 2024-01-11 LAB — CBC WITH DIFFERENTIAL/PLATELET
Abs Immature Granulocytes: 0.04 K/uL (ref 0.00–0.07)
Basophils Absolute: 0 K/uL (ref 0.0–0.1)
Basophils Relative: 0 %
Eosinophils Absolute: 0 K/uL (ref 0.0–0.5)
Eosinophils Relative: 0 %
HCT: 44.3 % (ref 39.0–52.0)
Hemoglobin: 14.6 g/dL (ref 13.0–17.0)
Immature Granulocytes: 0 %
Lymphocytes Relative: 24 %
Lymphs Abs: 3.4 K/uL (ref 0.7–4.0)
MCH: 27.3 pg (ref 26.0–34.0)
MCHC: 33 g/dL (ref 30.0–36.0)
MCV: 82.8 fL (ref 80.0–100.0)
Monocytes Absolute: 1.3 K/uL — ABNORMAL HIGH (ref 0.1–1.0)
Monocytes Relative: 9 %
Neutro Abs: 9.6 K/uL — ABNORMAL HIGH (ref 1.7–7.7)
Neutrophils Relative %: 67 %
Platelets: 199 K/uL (ref 150–400)
RBC: 5.35 MIL/uL (ref 4.22–5.81)
RDW: 13.2 % (ref 11.5–15.5)
WBC: 14.3 K/uL — ABNORMAL HIGH (ref 4.0–10.5)
nRBC: 0 % (ref 0.0–0.2)

## 2024-01-11 LAB — BASIC METABOLIC PANEL WITH GFR
Anion gap: 11 (ref 5–15)
BUN: 15 mg/dL (ref 6–20)
CO2: 23 mmol/L (ref 22–32)
Calcium: 9.1 mg/dL (ref 8.9–10.3)
Chloride: 103 mmol/L (ref 98–111)
Creatinine, Ser: 0.79 mg/dL (ref 0.61–1.24)
GFR, Estimated: >60 mL/min (ref >60)
Glucose, Bld: 83 mg/dL (ref 70–99)
Potassium: 3.4 mmol/L — ABNORMAL LOW (ref 3.5–5.1)
Sodium: 137 mmol/L (ref 135–145)

## 2024-01-11 MED ORDER — ONDANSETRON HCL 4 MG/2ML IJ SOLN: 4.0000 mg | Freq: Four times a day (QID) | INTRAMUSCULAR | Status: DC | PRN

## 2024-01-11 MED ORDER — METHOCARBAMOL 500 MG PO TABS
500.0000 mg | ORAL_TABLET | Freq: Three times a day (TID) | ORAL | Status: AC
  Administered 2024-01-11 – 2024-01-14 (×8): 500 mg via ORAL
  Filled 2024-01-11 (×8): qty 1

## 2024-01-11 MED ORDER — METHOCARBAMOL 500 MG PO TABS
500.0000 mg | ORAL_TABLET | Freq: Once | ORAL | Status: AC
Start: 2024-01-11 — End: 2024-01-11
  Administered 2024-01-11: 500 mg via ORAL
  Filled 2024-01-11: qty 1

## 2024-01-11 MED ORDER — LIDOCAINE HCL (PF) 1 % IJ SOLN
10.0000 mL | Freq: Once | INTRAMUSCULAR | Status: AC
  Administered 2024-01-11: 5 mL
  Filled 2024-01-11: qty 10

## 2024-01-11 MED ORDER — KETOROLAC TROMETHAMINE 15 MG/ML IJ SOLN
15.0000 mg | Freq: Once | INTRAMUSCULAR | Status: AC
  Administered 2024-01-11: 15 mg via INTRAVENOUS
  Filled 2024-01-11: qty 1

## 2024-01-11 MED ORDER — HYDROMORPHONE HCL 1 MG/ML IJ SOLN
0.5000 mg | Freq: Once | INTRAMUSCULAR | Status: AC
  Administered 2024-01-11: 0.5 mg via INTRAVENOUS
  Filled 2024-01-11: qty 0.5

## 2024-01-11 MED ORDER — HYDROMORPHONE HCL 1 MG/ML IJ SOLN
0.5000 mg | INTRAMUSCULAR | Status: DC | PRN
  Administered 2024-01-11: 0.5 mg via INTRAVENOUS
  Filled 2024-01-11: qty 0.5

## 2024-01-11 MED ORDER — ACETAMINOPHEN 500 MG PO TABS
1000.0000 mg | ORAL_TABLET | Freq: Four times a day (QID) | ORAL | Status: DC
  Administered 2024-01-11 – 2024-01-14 (×11): 1000 mg via ORAL
  Filled 2024-01-11 (×12): qty 2

## 2024-01-11 MED ORDER — OXYCODONE HCL 5 MG PO TABS
5.0000 mg | ORAL_TABLET | ORAL | Status: DC | PRN
  Administered 2024-01-11: 10 mg via ORAL
  Administered 2024-01-11 – 2024-01-14 (×2): 5 mg via ORAL
  Filled 2024-01-11: qty 1
  Filled 2024-01-11: qty 2
  Filled 2024-01-11: qty 1

## 2024-01-11 MED ORDER — METHOCARBAMOL 1000 MG/10ML IJ SOLN
500.0000 mg | Freq: Three times a day (TID) | INTRAMUSCULAR | Status: AC
  Filled 2024-01-11 (×4): qty 5

## 2024-01-11 MED ORDER — FENTANYL CITRATE (PF) 100 MCG/2ML IJ SOLN
100.0000 ug | Freq: Once | INTRAMUSCULAR | Status: AC
  Administered 2024-01-11: 100 ug via INTRAVENOUS
  Filled 2024-01-11: qty 2

## 2024-01-11 MED ORDER — METOPROLOL TARTRATE 5 MG/5ML IV SOLN: 5.0000 mg | Freq: Four times a day (QID) | INTRAVENOUS | Status: DC | PRN

## 2024-01-11 MED ORDER — ONDANSETRON 4 MG PO TBDP: 4.0000 mg | ORAL_TABLET | Freq: Four times a day (QID) | ORAL | Status: DC | PRN

## 2024-01-11 MED ORDER — DOCUSATE SODIUM 100 MG PO CAPS
100.0000 mg | ORAL_CAPSULE | Freq: Two times a day (BID) | ORAL | Status: DC
  Administered 2024-01-11 – 2024-01-14 (×6): 100 mg via ORAL
  Filled 2024-01-11 (×7): qty 1

## 2024-01-11 MED ORDER — POLYETHYLENE GLYCOL 3350 17 G PO PACK: 17.0000 g | PACK | Freq: Every day | ORAL | Status: DC | PRN

## 2024-01-11 MED ORDER — HYDRALAZINE HCL 20 MG/ML IJ SOLN: 10.0000 mg | INTRAMUSCULAR | Status: DC | PRN

## 2024-01-11 MED ORDER — ACETAMINOPHEN 500 MG PO TABS
1000.0000 mg | ORAL_TABLET | Freq: Once | ORAL | Status: AC
Start: 2024-01-11 — End: 2024-01-11
  Administered 2024-01-11: 1000 mg via ORAL
  Filled 2024-01-11: qty 2

## 2024-01-11 MED ORDER — ENOXAPARIN SODIUM 30 MG/0.3ML IJ SOSY
30.0000 mg | PREFILLED_SYRINGE | Freq: Two times a day (BID) | INTRAMUSCULAR | Status: DC
  Administered 2024-01-12: 30 mg via SUBCUTANEOUS
  Filled 2024-01-11 (×5): qty 0.3

## 2024-01-11 MED ORDER — KETOROLAC TROMETHAMINE 15 MG/ML IJ SOLN
15.0000 mg | Freq: Once | INTRAMUSCULAR | Status: AC
  Administered 2024-01-11: 15 mg via INTRAMUSCULAR
  Filled 2024-01-11: qty 1
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
## 2024-01-11 NOTE — ED Notes (Signed)
 Report given to Eleanore, RN on [facility] at which is the accepting nurse.

## 2024-01-11 NOTE — Progress Notes (Signed)
 Pt arrived to the unit. Alert and oriented x 4 and ambulatory. Has right chest tube in place put into suction at 20 cm. No air leak noted. Minimum output noted.

## 2024-01-12 ENCOUNTER — Inpatient Hospital Stay (HOSPITAL_COMMUNITY)

## 2024-01-12 ENCOUNTER — Encounter (HOSPITAL_COMMUNITY): Payer: Self-pay

## 2024-01-12 LAB — BASIC METABOLIC PANEL WITH GFR
Anion gap: 8 (ref 5–15)
BUN: 11 mg/dL (ref 6–20)
CO2: 25 mmol/L (ref 22–32)
Calcium: 9 mg/dL (ref 8.9–10.3)
Chloride: 103 mmol/L (ref 98–111)
Creatinine, Ser: 0.96 mg/dL (ref 0.61–1.24)
GFR, Estimated: >60 mL/min (ref >60)
Glucose, Bld: 95 mg/dL (ref 70–99)
Potassium: 3.5 mmol/L (ref 3.5–5.1)
Sodium: 136 mmol/L (ref 135–145)

## 2024-01-12 LAB — CBC
HCT: 44 % (ref 39.0–52.0)
Hemoglobin: 14.3 g/dL (ref 13.0–17.0)
MCH: 26.9 pg (ref 26.0–34.0)
MCHC: 32.5 g/dL (ref 30.0–36.0)
MCV: 82.7 fL (ref 80.0–100.0)
Platelets: 188 K/uL (ref 150–400)
RBC: 5.32 MIL/uL (ref 4.22–5.81)
RDW: 13.1 % (ref 11.5–15.5)
WBC: 10.6 K/uL — ABNORMAL HIGH (ref 4.0–10.5)
nRBC: 0 % (ref 0.0–0.2)

## 2024-01-12 MED ORDER — KETOROLAC TROMETHAMINE 30 MG/ML IJ SOLN
30.0000 mg | Freq: Four times a day (QID) | INTRAMUSCULAR | Status: DC
  Administered 2024-01-12 – 2024-01-14 (×8): 30 mg via INTRAVENOUS
  Filled 2024-01-12 (×8): qty 1
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
## 2024-01-12 NOTE — Progress Notes (Signed)
 OT Cancellation Note  Patient Details Name: Timothy Schaefer MRN: [id number] DOB: [birth year]   Cancelled Treatment:    Reason Eval/Treat Not Completed: (P) OT screened, no needs identified, will sign off, Ambulating around room independently, no current therapy needs, signing off.   [name] 01/12/2024, 1:47 PM
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
## 2024-01-12 NOTE — TOC CAGE-AID Note (Signed)
 Transition of Care [facility]) - CAGE-AID Screening  Patient Details  Name: Timothy Schaefer MRN: [id number] Date of Birth: 05-07-2000  Clinical Narrative:  Patient endorses occasional marijuana use, denies any alcohol use. Patient denies need for substance abuse resources at this time.  CAGE-AID Screening:    Have You Ever Felt You Ought to Cut Down on Your Drinking or Drug Use?: No Have People Annoyed You By Critizing Your Drinking Or Drug Use?: No Have You Felt Bad Or Guilty About Your Drinking Or Drug Use?: No Have You Ever Had a Drink or Used Drugs First Thing In The Morning to Steady Your Nerves or to Get Rid of a Hangover?: No CAGE-AID Score: 0  Substance Abuse Education Offered: No

## 2024-01-13 ENCOUNTER — Inpatient Hospital Stay (HOSPITAL_COMMUNITY)

## 2024-01-13 NOTE — Progress Notes (Signed)
 This patient refused the Lovenox  injection. I educated him on the risks of not taking it, he understood and agreed to ambulate more frequently throughout the day.  Notified the provider Burnard Banter, PA.
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
## 2024-01-13 NOTE — Progress Notes (Signed)
 Patient refused his Enoxaparin (Lovenox ) injection.MD Kinsinger made aware.

## 2024-01-13 NOTE — Plan of Care (Signed)
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
## 2024-01-14 ENCOUNTER — Inpatient Hospital Stay (HOSPITAL_COMMUNITY)

## 2024-01-14 ENCOUNTER — Other Ambulatory Visit (HOSPITAL_COMMUNITY): Payer: Self-pay

## 2024-01-14 MED ORDER — OXYCODONE HCL 5 MG PO TABS
5.0000 mg | ORAL_TABLET | Freq: Four times a day (QID) | ORAL | 0 refills | Status: AC | PRN
  Filled 2024-01-14: qty 15, 4d supply, fill #0

## 2024-01-14 MED ORDER — ACETAMINOPHEN 500 MG PO TABS: 1000.0000 mg | ORAL_TABLET | Freq: Four times a day (QID) | ORAL | Status: AC | PRN

## 2024-01-14 NOTE — Progress Notes (Signed)
 Removed R pigtail chest tube.  Pt tolerated well. CXR to be done in 4 hours.   Last imported Vital Signs BP 108/75 (BP Location: Right Arm)   Pulse 63   Temp 97.8 F (36.6 C) (Oral)   Resp 18   Ht 5' 8 (1.727 m)   Wt 134 lb (60.8 kg)   SpO2 91%   BMI 20.37 kg/m   Trending CBC Recent Labs    01/12/24 0704  WBC 10.6*  HGB 14.3  HCT 44.0  PLT 188    Trending Coag's No results for input(s): APTT, INR in the last 72 hours.  Trending BMET Recent Labs    01/12/24 0704  NA 136  K 3.5  CL 103  CO2 25  BUN 11  CREATININE 0.96  GLUCOSE 95    Kamari Bilek W  Trauma Response RN  Please call TRN at [phone] for further assistance.
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
## 2024-01-14 NOTE — Plan of Care (Signed)
  Problem: Education: Goal: Knowledge of General Education information will improve Description: Including pain rating scale, medication(s)/side effects and non-pharmacologic comfort measures 01/14/2024 1619 by Ajayla Iglesias K, RN Outcome: Adequate for Discharge 01/14/2024 1619 by Desaree Downen K, RN Outcome: Adequate for Discharge   Problem: Health Behavior/Discharge Planning: Goal: Ability to manage health-related needs will improve 01/14/2024 1619 by Raymondo Garcialopez K, RN Outcome: Adequate for Discharge 01/14/2024 1619 by Brylan Dec K, RN Outcome: Adequate for Discharge   Problem: Clinical Measurements: Goal: Ability to maintain clinical measurements within normal limits will improve 01/14/2024 1619 by Adaleah Forget K, RN Outcome: Adequate for Discharge 01/14/2024 1619 by Donaciano Range K, RN Outcome: Adequate for Discharge Goal: Will remain free from infection 01/14/2024 1619 by Stevey Stapleton K, RN Outcome: Adequate for Discharge 01/14/2024 1619 by Benjiman Sedgwick K, RN Outcome: Adequate for Discharge Goal: Diagnostic test results will improve 01/14/2024 1619 by Wisam Siefring K, RN Outcome: Adequate for Discharge 01/14/2024 1619 by Kariah Loredo K, RN Outcome: Adequate for Discharge Goal: Respiratory complications will improve 01/14/2024 1619 by Gary Bultman K, RN Outcome: Adequate for Discharge 01/14/2024 1619 by Kolson Chovanec K, RN Outcome: Adequate for Discharge Goal: Cardiovascular complication will be avoided 01/14/2024 1619 by Halah Whiteside K, RN Outcome: Adequate for Discharge 01/14/2024 1619 by Emmeline Winebarger K, RN Outcome: Adequate for Discharge   Problem: Activity: Goal: Risk for activity intolerance will decrease 01/14/2024 1619 by Abdalrahman Clementson K, RN Outcome: Adequate for Discharge 01/14/2024 1619 by Florian Dellis POUR, RN Outcome: Adequate for Discharge   Problem: Nutrition: Goal: Adequate nutrition will be maintained 01/14/2024 1619 by Vick Filter K,  RN Outcome: Adequate for Discharge 01/14/2024 1619 by Florian Dellis POUR, RN Outcome: Adequate for Discharge   Problem: Coping: Goal: Level of anxiety will decrease 01/14/2024 1619 by Jonuel Butterfield K, RN Outcome: Adequate for Discharge 01/14/2024 1619 by Jquan Egelston K, RN Outcome: Adequate for Discharge   Problem: Elimination: Goal: Will not experience complications related to bowel motility 01/14/2024 1619 by Marla Pouliot K, RN Outcome: Adequate for Discharge 01/14/2024 1619 by Dev Dhondt K, RN Outcome: Adequate for Discharge Goal: Will not experience complications related to urinary retention 01/14/2024 1619 by Yana Schorr K, RN Outcome: Adequate for Discharge 01/14/2024 1619 by Odies Desa K, RN Outcome: Adequate for Discharge   Problem: Pain Managment: Goal: General experience of comfort will improve and/or be controlled 01/14/2024 1619 by Syria Kestner K, RN Outcome: Adequate for Discharge 01/14/2024 1619 by Alvah Lagrow K, RN Outcome: Adequate for Discharge   Problem: Safety: Goal: Ability to remain free from injury will improve 01/14/2024 1619 by Theador Jezewski K, RN Outcome: Adequate for Discharge 01/14/2024 1619 by Mannie Ohlin K, RN Outcome: Adequate for Discharge   Problem: Skin Integrity: Goal: Risk for impaired skin integrity will decrease 01/14/2024 1619 by Giovanni Biby K, RN Outcome: Adequate for Discharge 01/14/2024 1619 by Krissa Utke K, RN Outcome: Adequate for Discharge

## 2024-01-14 NOTE — Progress Notes (Addendum)
 Discharge Summary: DC order noted per MD. DC RN at bedside with primary RN. TOC meds delivered to bedside.  Primary RN reviewing AVS.

## 2024-05-25 ENCOUNTER — Ambulatory Visit: Admitting: Physician Assistant
